# Patient Record
Sex: Male | Born: 1980 | Race: White | Hispanic: No | Marital: Single | Smoking: Current every day smoker
Health system: Southern US, Community
[De-identification: ages and names within clinical notes are randomized; demographics above are authoritative.]

## PROBLEM LIST (undated history)

## (undated) HISTORY — PX: APPENDECTOMY: SHX54

---

## 2014-08-02 ENCOUNTER — Emergency Department (HOSPITAL_COMMUNITY): Payer: No Typology Code available for payment source

## 2014-08-02 ENCOUNTER — Emergency Department (HOSPITAL_COMMUNITY)
Admission: EM | Admit: 2014-08-02 | Discharge: 2014-08-02 | Disposition: A | Discharge status: Home / Self Care | Payer: Self-pay | Attending: Emergency Medicine | Admitting: Emergency Medicine

## 2014-08-02 ENCOUNTER — Emergency Department (HOSPITAL_COMMUNITY): Payer: Self-pay

## 2014-08-02 ENCOUNTER — Encounter (HOSPITAL_COMMUNITY): Payer: Self-pay | Admitting: Emergency Medicine

## 2014-08-02 DIAGNOSIS — R112 Nausea with vomiting, unspecified: Secondary | ICD-10-CM | POA: Insufficient documentation

## 2014-08-02 DIAGNOSIS — Z720 Tobacco use: Secondary | ICD-10-CM | POA: Insufficient documentation

## 2014-08-02 DIAGNOSIS — K8020 Calculus of gallbladder without cholecystitis without obstruction: Secondary | ICD-10-CM | POA: Insufficient documentation

## 2014-08-02 LAB — CBC WITH DIFFERENTIAL/PLATELET
Basophils Absolute: 0 10*3/uL (ref 0.0–0.1)
Basophils Relative: 1 % (ref 0–1)
Eosinophils Absolute: 0.1 10*3/uL (ref 0.0–0.7)
Eosinophils Relative: 1 % (ref 0–5)
HEMATOCRIT: 42.2 % (ref 39.0–52.0)
HEMOGLOBIN: 14.8 g/dL (ref 13.0–17.0)
Lymphocytes Relative: 30 % (ref 12–46)
Lymphs Abs: 1.7 10*3/uL (ref 0.7–4.0)
MCH: 31.7 pg (ref 26.0–34.0)
MCHC: 35.1 g/dL (ref 30.0–36.0)
MCV: 90.4 fL (ref 78.0–100.0)
MONO ABS: 0.4 10*3/uL (ref 0.1–1.0)
MONOS PCT: 7 % (ref 3–12)
Neutro Abs: 3.4 10*3/uL (ref 1.7–7.7)
Neutrophils Relative %: 61 % (ref 43–77)
Platelets: 234 10*3/uL (ref 150–400)
RBC: 4.67 MIL/uL (ref 4.22–5.81)
RDW: 12.6 % (ref 11.5–15.5)
WBC: 5.6 10*3/uL (ref 4.0–10.5)

## 2014-08-02 LAB — RAPID URINE DRUG SCREEN, HOSP PERFORMED
AMPHETAMINES: NOT DETECTED
BARBITURATES: NOT DETECTED
BENZODIAZEPINES: NOT DETECTED
Cocaine: NOT DETECTED
Opiates: NOT DETECTED
Tetrahydrocannabinol: NOT DETECTED

## 2014-08-02 LAB — URINALYSIS, ROUTINE W REFLEX MICROSCOPIC
BILIRUBIN URINE: NEGATIVE
GLUCOSE, UA: NEGATIVE mg/dL
HGB URINE DIPSTICK: NEGATIVE
KETONES UR: NEGATIVE mg/dL
LEUKOCYTES UA: NEGATIVE
Nitrite: NEGATIVE
Protein, ur: NEGATIVE mg/dL
Specific Gravity, Urine: 1.02 (ref 1.005–1.030)
Urobilinogen, UA: 0.2 mg/dL (ref 0.0–1.0)
pH: 8 (ref 5.0–8.0)

## 2014-08-02 LAB — BASIC METABOLIC PANEL
Anion gap: 12 (ref 5–15)
BUN: 10 mg/dL (ref 6–23)
CALCIUM: 9.4 mg/dL (ref 8.4–10.5)
CHLORIDE: 106 meq/L (ref 96–112)
CO2: 26 mEq/L (ref 19–32)
CREATININE: 0.79 mg/dL (ref 0.50–1.35)
GFR calc non Af Amer: >90 mL/min (ref >90)
Glucose, Bld: 98 mg/dL (ref 70–99)
Potassium: 4.3 mEq/L (ref 3.7–5.3)
Sodium: 144 mEq/L (ref 137–147)

## 2014-08-02 MED ORDER — OXYCODONE-ACETAMINOPHEN 5-325 MG PO TABS
1.0000 | ORAL_TABLET | Freq: Once | ORAL | Status: AC
  Administered 2014-08-02: 1 via ORAL
  Filled 2014-08-02: qty 1

## 2014-08-02 MED ORDER — IOHEXOL 300 MG/ML  SOLN
100.0000 mL | Freq: Once | INTRAMUSCULAR | Status: AC | PRN
  Administered 2014-08-02: 100 mL via INTRAVENOUS

## 2014-08-02 MED ORDER — ONDANSETRON HCL 4 MG PO TABS: 4.0000 mg | ORAL_TABLET | Freq: Four times a day (QID) | ORAL | Status: AC

## 2014-08-02 MED ORDER — OXYCODONE-ACETAMINOPHEN 5-325 MG PO TABS
1.0000 | ORAL_TABLET | ORAL | Status: AC | PRN
Start: 2014-08-02 — End: ?

## 2014-08-02 NOTE — Discharge Instructions (Signed)
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
## 2014-08-02 NOTE — Progress Notes (Signed)
Patient not a resident of [place] no referral resource given.

## 2014-08-02 NOTE — ED Notes (Signed)
Pt verbalized understanding to use caution and no driving within 4 hours of taking pain med due to med causes drowsiness, also made aware pain med causes constipation

## 2014-08-02 NOTE — ED Provider Notes (Signed)
Medical screening examination/treatment/procedure(s) were performed by non-physician practitioner and as supervising physician I was immediately available for consultation/collaboration.   EKG Interpretation None        Shanda Cadotte L Briceson Broadwater, MD 08/02/14 1508 
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
